# Patient Record
Sex: Male | Born: 1970 | Race: White | Hispanic: No | Marital: Single | Smoking: Never smoker
Health system: Southern US, Community
[De-identification: ages and names within clinical notes are randomized; demographics above are authoritative.]

## PROBLEM LIST (undated history)

## (undated) DIAGNOSIS — T7840XA Allergy, unspecified, initial encounter: Secondary | ICD-10-CM

## (undated) DIAGNOSIS — K08409 Partial loss of teeth, unspecified cause, unspecified class: Secondary | ICD-10-CM

## (undated) DIAGNOSIS — R002 Palpitations: Secondary | ICD-10-CM

## (undated) DIAGNOSIS — E785 Hyperlipidemia, unspecified: Secondary | ICD-10-CM

## (undated) HISTORY — DX: Palpitations: R00.2

## (undated) HISTORY — DX: Allergy, unspecified, initial encounter: T78.40XA

## (undated) HISTORY — DX: Hyperlipidemia, unspecified: E78.5

## (undated) HISTORY — DX: Partial loss of teeth, unspecified cause, unspecified class: K08.409

## (undated) HISTORY — PX: WISDOM TOOTH EXTRACTION: SHX21

---

## 2019-10-23 ENCOUNTER — Encounter: Payer: Self-pay | Admitting: Gastroenterology

## 2019-12-03 ENCOUNTER — Other Ambulatory Visit: Payer: Self-pay

## 2019-12-03 ENCOUNTER — Ambulatory Visit: Payer: 59 | Admitting: Cardiology

## 2019-12-03 ENCOUNTER — Encounter: Payer: Self-pay | Admitting: Cardiology

## 2019-12-03 VITALS — BP 120/80 | HR 72 | Ht 71.0 in | Wt 195.6 lb

## 2019-12-03 DIAGNOSIS — Z87892 Personal history of anaphylaxis: Secondary | ICD-10-CM | POA: Diagnosis not present

## 2019-12-03 DIAGNOSIS — R9431 Abnormal electrocardiogram [ECG] [EKG]: Secondary | ICD-10-CM | POA: Diagnosis not present

## 2019-12-03 DIAGNOSIS — Z8241 Family history of sudden cardiac death: Secondary | ICD-10-CM

## 2019-12-03 NOTE — Progress Notes (Signed)
Cardiology Office Note:    Date:  12/03/2019   ID:  Andrew Ingram, DOB 21-Apr-1971, MRN [id number]  PCP:  Sueanne Margarita, DO  [facility] Cardiologist:  [name], MD  [facility] Electrophysiologist:  None   Referring MD: Sueanne Margarita, DO    History of Present Illness:    Andrew Ingram is a 49 y.o. male here for the evaluation of family history of sudden cardiac death at the request of Dr. Maebelle Munroe  Has had a history of palpitations especially during the busy time of year with increased stress.  About 2 years ago felt a couple minutes of palpitations around Christmas time.  Maybe was feeling a panic attack at the time. Had some tightness in chest then, a couple years ago.  This has not been any persistent symptom.  Brother has been tested previously with EKG  He is also had yellowjacket allergy and has EpiPen handy.  He also had a younger brother who had a positive colonoscopy and wanted a referral to GI from his primary care physician.  His father died of arrhythmia at age 54, sudden cardiac death.  He works for the city of [employer] enjoys hiking golf yard work, worked at [employer] for 8 years.  Non-smoker.  Prior office notes reviewed from outside office.  Lab work reviewed.  Allergies:   Patient has no allergy information on record.   Social History   Socioeconomic History  . Marital status: Single    Spouse name: Not on file  . Number of children: Not on file  . Years of education: Not on file  . Highest education level: Not on file  Occupational History  . Not on file  Tobacco Use  . Smoking status: Never Smoker  . Smokeless tobacco: Never Used  Substance and Sexual Activity  . Alcohol use: Not on file  . Drug use: Not on file  . Sexual activity: Not on file  Other Topics Concern  . Not on file  Social History Narrative  . Not on file   Social Determinants of Health   Financial Resource Strain:   . Difficulty of Paying  Living Expenses:   Food Insecurity:   . Worried About Charity fundraiser in the Last Year:   . Arboriculturist in the Last Year:   Transportation Needs:   . Film/video editor (Medical):   Marland Kitchen Lack of Transportation (Non-Medical):   Physical Activity:   . Days of Exercise per Week:   . Minutes of Exercise per Session:   Stress:   . Feeling of Stress :   Social Connections:   . Frequency of Communication with Friends and Family:   . Frequency of Social Gatherings with Friends and Family:   . Attends Religious Services:   . Active Member of Clubs or Organizations:   . Attends Archivist Meetings:   Marland Kitchen Marital Status:      Family History: The patient's family history includes Sudden Cardiac Death (age of onset: 46) in his father.  ROS:   Please see the history of present illness.    No fevers chills nausea vomiting syncope bleeding all other systems reviewed and are negative.  EKGs/Labs/Other Studies Reviewed:    The following studies were reviewed today: Prior EKG from office outside  EKG: Sinus rhythm, heart rate 73 bpm, left axis deviation, no ST segment changes.  This is from outside office personally reviewed and interpreted.  Recent Labs: No results  found for requested labs within last 8760 hours.  Recent Lipid Panel No results found for: CHOL, TRIG, HDL, CHOLHDL, VLDL, LDLCALC, LDLDIRECT  Physical Exam:    VS:  BP 120/80   Pulse 72   Ht 5\' 11"  (1.803 m)   Wt 195 lb 9.6 oz (88.7 kg)   SpO2 99%   BMI 27.28 kg/m     Wt Readings from Last 3 Encounters:  12/03/19 195 lb 9.6 oz (88.7 kg)     GEN:  Well nourished, well developed in no acute distress HEENT: Normal NECK: No JVD; No carotid bruits LYMPHATICS: No lymphadenopathy CARDIAC: RRR, no murmurs, rubs, gallops RESPIRATORY:  Clear to auscultation without rales, wheezing or rhonchi  ABDOMEN: Soft, non-tender, non-distended MUSCULOSKELETAL:  No edema; No deformity  SKIN: Warm and  dry NEUROLOGIC:  Alert and oriented x 3 PSYCHIATRIC:  Normal affect   ASSESSMENT:    1. Nonspecific abnormal electrocardiogram (ECG) (EKG)   2. Family history of sudden cardiac death   3. History of anaphylaxis   4. Abnormal electrocardiogram    PLAN:    In order of problems listed above:  Family history of sudden cardiac death, abnormal EKG with left axis deviation -Father 2 died suddenly with arrhythmia. -We will go ahead and check an echocardiogram to ensure proper structure and function of his heart.  Try to exclude any phenotypic evidence of hypertrophic cardiomyopathy. -I will also check a coronary calcium score, $150, to help with further prevention efforts.  This may change therapy. -Current LDL 127 HDL 69 triglycerides 70, hemoglobin 16.7 creatinine 0.8 ALT 7 TSH 1.0 from outside labs. -Continue with diet and exercise.  History of anaphylaxis with yellowjacket -Has EpiPen.  Of course please let us know if you have any other questions or need any further assistance.  Medication Adjustments/Labs and Tests Ordered: Current medicines are reviewed at length with the patient today.  Concerns regarding medicines are outlined above.  Orders Placed This Encounter  Procedures  . CT CARDIAC SCORING  . ECHOCARDIOGRAM COMPLETE   No orders of the defined types were placed in this encounter.   Patient Instructions  Medication Instructions:  The current medical regimen is effective;  continue present plan and medications.  *If you need a refill on your cardiac medications before your next appointment, please call your pharmacy*  Testing/Procedures: Your physician has requested that you have an echocardiogram. Echocardiography is a painless test that uses sound waves to create images of your heart. It provides your doctor with information about the size and shape of your heart and how well your heart's chambers and valves are working. This procedure takes approximately one hour.  There are no restrictions for this procedure.  Your physician has requested that you have Coronary Calcium score which is completed by cardiac computed tomography (CT) is a painless test that uses an x-ray machine to take clear, detailed pictures of your heart.   Follow-Up: At [facility], you and your health needs are our priority.  As part of our continuing mission to provide you with exceptional heart care, we have created designated Provider Care Teams.  These Care Teams include your primary Cardiologist (physician) and Advanced Practice Providers (APPs -  Physician Assistants and Nurse Practitioners) who all work together to provide you with the care you need, when you need it.  We recommend signing up for the patient portal called "MyChart".  Sign up information is provided on this After Visit Summary.  MyChart is used to connect with  patients for Virtual Visits (Telemedicine).  Patients are able to view lab/test results, encounter notes, upcoming appointments, etc.  Non-urgent messages can be sent to your provider as well.   To learn more about what you can do with MyChart, go to [email or web address].    Follow up will be based on the reason of t he above testing.   Thank you for choosing [facility]!!         Signed, [name], MD  12/03/2019 2:59 PM    [place]

## 2019-12-03 NOTE — Patient Instructions (Signed)
Medication Instructions:  The current medical regimen is effective;  continue present plan and medications.  *If you need a refill on your cardiac medications before your next appointment, please call your pharmacy*  Testing/Procedures: Your physician has requested that you have an echocardiogram. Echocardiography is a painless test that uses sound waves to create images of your heart. It provides your doctor with information about the size and shape of your heart and how well your heart's chambers and valves are working. This procedure takes approximately one hour. There are no restrictions for this procedure.  Your physician has requested that you have Coronary Calcium score which is completed by cardiac computed tomography (CT) is a painless test that uses an x-ray machine to take clear, detailed pictures of your heart.   Follow-Up: At [facility], you and your health needs are our priority.  As part of our continuing mission to provide you with exceptional heart care, we have created designated Provider Care Teams.  These Care Teams include your primary Cardiologist (physician) and Advanced Practice Providers (APPs -  Physician Assistants and Nurse Practitioners) who all work together to provide you with the care you need, when you need it.  We recommend signing up for the patient portal called "MyChart".  Sign up information is provided on this After Visit Summary.  MyChart is used to connect with patients for Virtual Visits (Telemedicine).  Patients are able to view lab/test results, encounter notes, upcoming appointments, etc.  Non-urgent messages can be sent to your provider as well.   To learn more about what you can do with MyChart, go to [email or web address].    Follow up will be based on the reason of t he above testing.   Thank you for choosing [facility]!!

## 2019-12-10 ENCOUNTER — Other Ambulatory Visit: Payer: Self-pay

## 2019-12-10 ENCOUNTER — Ambulatory Visit (AMBULATORY_SURGERY_CENTER): Payer: Self-pay | Admitting: *Deleted

## 2019-12-10 VITALS — Ht 71.0 in | Wt 193.6 lb

## 2019-12-10 DIAGNOSIS — Z1211 Encounter for screening for malignant neoplasm of colon: Secondary | ICD-10-CM

## 2019-12-10 NOTE — Progress Notes (Signed)
cov vacc x 2  No egg or soy allergy known to patient  No issues with past sedation with any surgeries or procedures no intubation problems in the past  No diet pills per patient No home 02 use per patient  No blood thinners per patient  Pt denies issues with constipation  No A fib or A flutter  EMMI video to pt or MyChart  COVID 19 guidelines implemented in PV today   In PV today- noted pt saw Cardio 7-26- he had an Abnormal EKG- sudden death from arrhythmia in father so cardio has ordered Echo and CT cardiac scoring 01-21-2020- due to pending cardiac testing after scheduled colon 8-16- Colon canceled - Pt instructed he needs to Complete cardiac testing and get cardiac clearance prior to Colon- he verbalized understanding- recall placed for 10-1 #[id number] 8-16 colon canceled   Due to the COVID-19 pandemic we are asking patients to follow these guidelines. Please only bring one care partner. Please be aware that your care partner may wait in the car in the parking lot or if they feel like they will be too hot to wait in the car, they may wait in the lobby on the 4th floor. All care partners are required to wear a mask the entire time (we do not have any that we can provide them), they need to practice social distancing, and we will do a Covid check for all patient's and care partners when you arrive. Also we will check their temperature and your temperature. If the care partner waits in their car they need to stay in the parking lot the entire time and we will call them on their cell phone when the patient is ready for discharge so they can bring the car to the front of the building. Also all patient's will need to wear a mask into building.

## 2019-12-24 ENCOUNTER — Encounter: Payer: Self-pay | Admitting: Gastroenterology

## 2020-01-21 ENCOUNTER — Ambulatory Visit (HOSPITAL_COMMUNITY): Payer: 59 | Attending: Cardiology

## 2020-01-21 ENCOUNTER — Other Ambulatory Visit: Payer: Self-pay

## 2020-01-21 ENCOUNTER — Ambulatory Visit (INDEPENDENT_AMBULATORY_CARE_PROVIDER_SITE_OTHER)
Admission: RE | Admit: 2020-01-21 | Discharge: 2020-01-21 | Disposition: A | Discharge status: Home / Self Care | Payer: Self-pay | Source: Ambulatory Visit | Attending: Cardiology | Admitting: Cardiology

## 2020-01-21 DIAGNOSIS — Z8241 Family history of sudden cardiac death: Secondary | ICD-10-CM

## 2020-01-21 DIAGNOSIS — R9431 Abnormal electrocardiogram [ECG] [EKG]: Secondary | ICD-10-CM

## 2020-01-21 LAB — ECHOCARDIOGRAM COMPLETE
Area-P 1/2: 1.96 cm2
S' Lateral: 2.4 cm

## 2020-01-22 ENCOUNTER — Telehealth: Payer: Self-pay | Admitting: Cardiology

## 2020-01-22 DIAGNOSIS — E7800 Pure hypercholesterolemia, unspecified: Secondary | ICD-10-CM

## 2020-01-22 DIAGNOSIS — R931 Abnormal findings on diagnostic imaging of heart and coronary circulation: Secondary | ICD-10-CM

## 2020-01-22 MED ORDER — ROSUVASTATIN CALCIUM 20 MG PO TABS
20.0000 mg | ORAL_TABLET | Freq: Every day | ORAL | 3 refills | Status: DC
Start: 2020-01-22 — End: 2020-11-03

## 2020-01-22 NOTE — Telephone Encounter (Signed)
°  Patient is returning a call regarding his CT results

## 2020-01-22 NOTE — Telephone Encounter (Addendum)
Calcium score is 41 which is higher than expected for someone your age. 83rd percentile.  Based upon these findings and strong family history, I would recommend utilizing Crestor 20 mg once a day for risk factor reduction, to lower risk of heart attack. After starting this medication, we will check a lipid panel and ALT in 3 months. LDL was 127. Our goal will be less than 70.  In addition, aortic root was mildly dilated, 43 mm. I would like to repeat an echocardiogram in 1 year to monitor this. Pressure is under good control.  Andrew Furbish, MD   Called patient with results written above. Will send Crestor to patient's pharmacy of choice. Patient will get repeat lab work in December. Placed recall for patient to follow up with Andrew Ingram in one year. Patient was concerned about being cleared for colonoscopy in the near future. Will send to Andrew Ingram for advisement.

## 2020-01-24 ENCOUNTER — Telehealth: Payer: Self-pay | Admitting: *Deleted

## 2020-01-24 ENCOUNTER — Telehealth: Payer: Self-pay | Admitting: Cardiology

## 2020-01-24 NOTE — Telephone Encounter (Signed)
[facility] Pre-operative Risk Assessment     Request for surgical clearance:     Endoscopy Procedure  What type of surgery is being performed?     Colonoscopy   When is this surgery scheduled?     Required to have cardiac clearance prior to scheduling colonoscopy per Dr Harl Bowie   What type of clearance is required ?   Pharmacy/ Cardiac Clearance from Cardiologist due to elevated Cardiac Calcium Score at his age    Are there any medications that need to be held prior to surgery and how long? No unless he is on a blood thinner that's not listed in [place] name and name of physician performing surgery?      [facility]  What is your office phone and fax number?      Phone- [phone]  [phone]  Anesthesia type (None, local, MAC, general) ?       [facility]

## 2020-01-24 NOTE — Telephone Encounter (Signed)
Spoke with patient who is asking if he will have to take a statin long term.  Advised pt that general pts are required to continue to take a statin long term to keep cholesterol levels under control and also in his case, as preventive treatment for further coronary artery plaque/calcification.  Pt states understanding.  All questions were answered at the time of the call.  Pt was very grateful for the call back and information regarding his treatment plan.  He reports he will go ahead and start taking the Crestor as RXed.  He will c/b with any further questions/concerns.

## 2020-01-24 NOTE — Telephone Encounter (Signed)
See phone note from 9/16 for further documentation r/t statin therapy.   Also of note - message was sent to Hetty Ely, RN in GI requesting if pt needs cardiac clearance, for them to pls fax a formal request with type/date of procedure, type of anesthesia being used and if they require pt to hold any medications for the procedure.

## 2020-01-24 NOTE — Telephone Encounter (Signed)
Pt c/o medication issue:  1. Name of Medication: rosuvastatin (CRESTOR) 20 MG tablet  2. How are you currently taking this medication (dosage and times per day)? Not currently taking it   3. Are you having a reaction (difficulty breathing--STAT)? No   4. What is your medication issue? Gualberto is calling with additional questions in regards to this medication for Olin Hauser. Please advise.

## 2020-01-25 NOTE — Telephone Encounter (Signed)
Cardiac Clearance Faxed to Dr Marlou Porch office - attn Olin Hauser [place] to [phone] [facility]

## 2020-01-28 NOTE — Telephone Encounter (Signed)
I agree.  May proceed without any further ischemic testing.  [name], MD

## 2020-01-28 NOTE — Telephone Encounter (Signed)
Dr Silverio Decamp, I saw this pt in [place] 12-10-2019- In PV noted pt saw Cardio 7-26- he had an Abnormal EKG- sudden death from arrhythmia in father so cardio has ordered Echo and CT cardiac scoring 01-21-2020- due to pending cardiac testing after scheduled colon 8-16- Colon canceled - Pt instructed he needs to Complete cardiac testing and get cardiac clearance prior to Colon- he verbalized understanding- recall placed for 10-1 #[id number] 8-16 colon canceled   We have  cardiac clearance as below. Is it ok to schedule his colon now?  Please advise., Thanks Lelan Pons PV

## 2020-01-28 NOTE — Telephone Encounter (Signed)
   Primary Cardiologist: [name], MD  Chart reviewed as part of pre-operative protocol coverage. Patient was contacted 01/28/2020 in reference to pre-operative risk assessment for pending surgery as outlined below.  Andrew Ingram was last seen on 12/03/19 by Dr. Marlou Porch. He has family history of sudden cardiac death and otherwise hx of bee sting allergy. Calcium scoring was performed in the 83rd percentile. 2D echo with normal LVEF and dilated aortic root. He was not felt to require ischemic testing at that time.  GI requesting cardiac clearance for colonoscopy which is a low risk procedure. I spoke with patient who affirms he is able to exercise by walking and playing golf without any chest pain or dyspnea. Able to complete >4 METS without angina. Therefore, based on ACC/AHA guidelines, Andrew Ingram would be at acceptable risk for the planned procedure without further cardiovascular testing. Will also cc to Dr. Marlou Porch to make him aware clearance granted based on lack of ischemic symptoms.  I will route this recommendation to the requesting party via Epic fax function and remove from pre-op pool. Please call with questions.  Charlie Pitter, PA-C 01/28/2020, 9:33 AM

## 2020-01-30 NOTE — Telephone Encounter (Signed)
Yes, thanks

## 2020-01-30 NOTE — Telephone Encounter (Signed)
Called Andrew Ingram to call us and schedule colon and PV- as we have cardiac clearance from Dr Marlou Porch and Faythe Ghee to proceed per Dr Doneen Poisson PV

## 2020-01-31 NOTE — Telephone Encounter (Signed)
Lm on voice mail to call and schedule colon and PV as we have all clearance we need- marie PV

## 2020-02-05 ENCOUNTER — Encounter: Payer: Self-pay | Admitting: Gastroenterology

## 2020-02-27 ENCOUNTER — Other Ambulatory Visit: Payer: Self-pay

## 2020-02-27 ENCOUNTER — Ambulatory Visit (AMBULATORY_SURGERY_CENTER): Payer: Self-pay

## 2020-02-27 VITALS — Ht 71.0 in | Wt 192.0 lb

## 2020-02-27 DIAGNOSIS — Z1211 Encounter for screening for malignant neoplasm of colon: Secondary | ICD-10-CM

## 2020-02-27 MED ORDER — PLENVU 140 G PO SOLR: 1.0000 | ORAL | 0 refills | Status: DC

## 2020-02-27 NOTE — Progress Notes (Signed)
No egg or soy allergy known to patient  No issues with past sedation with any surgeries or procedures----never had surgery No intubation problems in the past ----never had surgery No FH of Malignant Hyperthermia No diet pills per patient No home 02 use per patient  No blood thinners per patient  Pt denies issues with constipation  No A fib or A flutter  EMMI video to pt COVID 19 guidelines implemented in PV today with Pt and RN  Coupon given to pt in PV today , Code to Pharmacy  COVID vaccines completed on 08/2019 per pt;  Due to the COVID-19 pandemic we are asking patients to follow these guidelines. Please only bring one care partner. Please be aware that your care partner may wait in the car in the parking lot or if they feel like they will be too hot to wait in the car, they may wait in the lobby on the 4th floor. All care partners are required to wear a mask the entire time (we do not have any that we can provide them), they need to practice social distancing, and we will do a Covid check for all patient's and care partners when you arrive. Also we will check their temperature and your temperature. If the care partner waits in their car they need to stay in the parking lot the entire time and we will call them on their cell phone when the patient is ready for discharge so they can bring the car to the front of the building. Also all patient's will need to wear a mask into building.

## 2020-02-28 ENCOUNTER — Encounter: Payer: Self-pay | Admitting: Gastroenterology

## 2020-03-17 ENCOUNTER — Encounter: Payer: 59 | Admitting: Internal Medicine

## 2020-03-17 ENCOUNTER — Encounter: Payer: Self-pay | Admitting: Gastroenterology

## 2020-03-17 ENCOUNTER — Other Ambulatory Visit: Payer: Self-pay

## 2020-03-17 ENCOUNTER — Ambulatory Visit (AMBULATORY_SURGERY_CENTER): Payer: 59 | Admitting: Gastroenterology

## 2020-03-17 VITALS — BP 115/79 | HR 65 | Temp 98.7°F | Resp 18 | Ht 71.0 in | Wt 192.0 lb

## 2020-03-17 DIAGNOSIS — D128 Benign neoplasm of rectum: Secondary | ICD-10-CM

## 2020-03-17 DIAGNOSIS — Z1211 Encounter for screening for malignant neoplasm of colon: Secondary | ICD-10-CM

## 2020-03-17 DIAGNOSIS — D123 Benign neoplasm of transverse colon: Secondary | ICD-10-CM

## 2020-03-17 DIAGNOSIS — D122 Benign neoplasm of ascending colon: Secondary | ICD-10-CM | POA: Diagnosis not present

## 2020-03-17 MED ORDER — SODIUM CHLORIDE 0.9 % IV SOLN: 500.0000 mL | Freq: Once | INTRAVENOUS | Status: DC

## 2020-03-17 NOTE — Progress Notes (Signed)
VS by CW  Pt's states no medical or surgical changes since previsit or office visit.  

## 2020-03-17 NOTE — Op Note (Signed)
[facility] Patient Name: Andrew Ingram Procedure Date: 03/17/2020 7:57 AM MRN: [id number] Endoscopist: Mallie Mussel L. Loletha Carrow , MD Age: 49 Referring MD:  Date of Birth: July 26, 1970 Gender: Male Account #: [id number] Procedure:                Colonoscopy Indications:              Screening for colorectal malignant neoplasm, This                            is the patient's first colonoscopy Medicines:                Monitored Anesthesia Care Procedure:                Pre-Anesthesia Assessment:                           - Prior to the procedure, a History and Physical                            was performed, and patient medications and                            allergies were reviewed. The patient's tolerance of                            previous anesthesia was also reviewed. The risks                            and benefits of the procedure and the sedation                            options and risks were discussed with the patient.                            All questions were answered, and informed consent                            was obtained. Prior Anticoagulants: The patient has                            taken no previous anticoagulant or antiplatelet                            agents. ASA Grade Assessment: II - A patient with                            mild systemic disease. After reviewing the risks                            and benefits, the patient was deemed in                            satisfactory condition to undergo the procedure.  After obtaining informed consent, the colonoscope                            was passed under direct vision. Throughout the                            procedure, the patient's blood pressure, pulse, and                            oxygen saturations were monitored continuously. The                            Colonoscope was introduced through the anus and                            advanced to the the cecum,  identified by                            appendiceal orifice and ileocecal valve. The                            colonoscopy was performed without difficulty. The                            patient tolerated the procedure well. The quality                            of the bowel preparation was excellent. The                            ileocecal valve, appendiceal orifice, and rectum                            were photographed. The bowel preparation used was                            Plenvu. Scope In: 8:11:45 AM Scope Out: 8:28:56 AM Scope Withdrawal Time: 0 hours 12 minutes 46 seconds  Total Procedure Duration: 0 hours 17 minutes 11 seconds  Findings:                 The perianal and digital rectal examinations were                            normal.                           Three sessile polyps were found in the rectum,                            proximal transverse colon and ascending colon. The                            polyps were 3 to 5 mm in size. These polyps were  removed with a cold snare. Resection and retrieval                            were complete.                           The exam was otherwise without abnormality on                            direct and retroflexion views. Complications:            No immediate complications. Estimated Blood Loss:     Estimated blood loss was minimal. Impression:               - Three 3 to 5 mm polyps in the rectum, in the                            proximal transverse colon and in the ascending                            colon, removed with a cold snare. Resected and                            retrieved.                           - The examination was otherwise normal on direct                            and retroflexion views. Recommendation:           - Patient has a contact number available for                            emergencies. The signs and symptoms of potential                             delayed complications were discussed with the                            patient. Return to normal activities tomorrow.                            Written discharge instructions were provided to the                            patient.                           - Resume previous diet.                           - Continue present medications.                           - Await pathology results.                           -  Repeat colonoscopy is recommended for                            surveillance. The colonoscopy date will be                            determined after pathology results from today's                            exam become available for review. Mersadies Petree L. Loletha Carrow, MD 03/17/2020 8:34:48 AM This report has been signed electronically.

## 2020-03-17 NOTE — Progress Notes (Signed)
To PACU, VSS. Report to [email or web address] 

## 2020-03-17 NOTE — Progress Notes (Signed)
Called to room to assist during endoscopic procedure.  Patient ID and intended procedure confirmed with present staff. Received instructions for my participation in the procedure from the performing physician.  

## 2020-03-17 NOTE — Patient Instructions (Signed)
Please read handouts provided. Continue present medications. Await pathology results.   YOU HAD AN ENDOSCOPIC PROCEDURE TODAY AT THE [facility]:   Refer to the procedure report that was given to you for any specific questions about what was found during the examination.  If the procedure report does not answer your questions, please call your gastroenterologist to clarify.  If you requested that your care partner not be given the details of your procedure findings, then the procedure report has been included in a sealed envelope for you to review at your convenience later.  YOU SHOULD EXPECT: Some feelings of bloating in the abdomen. Passage of more gas than usual.  Walking can help get rid of the air that was put into your GI tract during the procedure and reduce the bloating. If you had a lower endoscopy (such as a colonoscopy or flexible sigmoidoscopy) you may notice spotting of blood in your stool or on the toilet paper. If you underwent a bowel prep for your procedure, you may not have a normal bowel movement for a few days.  Please Note:  You might notice some irritation and congestion in your nose or some drainage.  This is from the oxygen used during your procedure.  There is no need for concern and it should clear up in a day or so.  SYMPTOMS TO REPORT IMMEDIATELY:  Following lower endoscopy (colonoscopy or flexible sigmoidoscopy):  Excessive amounts of blood in the stool  Significant tenderness or worsening of abdominal pains  Swelling of the abdomen that is new, acute  Fever of 100F or higher   For urgent or emergent issues, a gastroenterologist can be reached at any hour by calling [phone]. Do not use MyChart messaging for urgent concerns.    DIET:  We do recommend a small meal at first, but then you may proceed to your regular diet.  Drink plenty of fluids but you should avoid alcoholic beverages for 24 hours.  ACTIVITY:  You should plan to take it easy  for the rest of today and you should NOT DRIVE or use heavy machinery until tomorrow (because of the sedation medicines used during the test).    FOLLOW UP: Our staff will call the number listed on your records 48-72 hours following your procedure to check on you and address any questions or concerns that you may have regarding the information given to you following your procedure. If we do not reach you, we will leave a message.  We will attempt to reach you two times.  During this call, we will ask if you have developed any symptoms of COVID 19. If you develop any symptoms (ie: fever, flu-like symptoms, shortness of breath, cough etc.) before then, please call [phone].  If you test positive for Covid 19 in the 2 weeks post procedure, please call and report this information to us.    If any biopsies were taken you will be contacted by phone or by letter within the next 1-3 weeks.  Please call us at [phone] if you have not heard about the biopsies in 3 weeks.    SIGNATURES/CONFIDENTIALITY: You and/or your care partner have signed paperwork which will be entered into your electronic medical record.  These signatures attest to the fact that that the information above on your After Visit Summary has been reviewed and is understood.  Full responsibility of the confidentiality of this discharge information lies with you and/or your care-partner.  

## 2020-03-19 ENCOUNTER — Telehealth: Payer: Self-pay | Admitting: *Deleted

## 2020-03-19 NOTE — Telephone Encounter (Signed)
Attempted 2nd f/u phone call. No answer. Left message.  °

## 2020-03-19 NOTE — Telephone Encounter (Signed)
First f/u call attempt.  LVM

## 2020-03-26 ENCOUNTER — Encounter: Payer: Self-pay | Admitting: Gastroenterology

## 2020-04-21 ENCOUNTER — Other Ambulatory Visit: Payer: 59 | Admitting: *Deleted

## 2020-04-21 ENCOUNTER — Other Ambulatory Visit: Payer: Self-pay

## 2020-04-21 DIAGNOSIS — E7800 Pure hypercholesterolemia, unspecified: Secondary | ICD-10-CM

## 2020-04-21 DIAGNOSIS — R931 Abnormal findings on diagnostic imaging of heart and coronary circulation: Secondary | ICD-10-CM

## 2020-04-21 LAB — LIPID PANEL
Chol/HDL Ratio: 1.8 ratio (ref 0.0–5.0)
Cholesterol, Total: 128 mg/dL (ref 100–199)
HDL: 71 mg/dL (ref >39)
LDL Chol Calc (NIH): 47 mg/dL (ref 0–99)
Triglycerides: 39 mg/dL (ref 0–149)
VLDL Cholesterol Cal: 10 mg/dL (ref 5–40)

## 2020-04-21 LAB — ALT: ALT: 20 IU/L (ref 0–44)

## 2020-09-22 ENCOUNTER — Telehealth: Payer: Self-pay | Admitting: Cardiology

## 2020-09-22 DIAGNOSIS — Z8241 Family history of sudden cardiac death: Secondary | ICD-10-CM

## 2020-09-22 DIAGNOSIS — I77810 Thoracic aortic ectasia: Secondary | ICD-10-CM

## 2020-09-22 NOTE — Telephone Encounter (Signed)
Patient called to schedule his 1 year f/u and it states he needs an echo prior to the appointment, but there is no order.

## 2020-09-23 NOTE — Telephone Encounter (Signed)
Jerline Pain, MD  01/22/2020 1:38 PM EDT      Normal pump function Normal valves.  Dilated aortic root (50mm on ECHO, 71mm on CT)  Repeating echo in one year to monitor aorta  [name], MD   Order place for echo to be completed on or after 01/21/2021.

## 2020-09-24 NOTE — Telephone Encounter (Signed)
Message sent to scheduling for pt to be contacted to be scheduled as ordered.

## 2020-11-03 ENCOUNTER — Encounter: Payer: Self-pay | Admitting: Family

## 2020-11-03 ENCOUNTER — Other Ambulatory Visit: Payer: Self-pay

## 2020-11-03 ENCOUNTER — Ambulatory Visit: Payer: 59 | Admitting: Family

## 2020-11-03 VITALS — BP 116/80 | HR 64 | Ht 71.0 in | Wt 200.6 lb

## 2020-11-03 DIAGNOSIS — E785 Hyperlipidemia, unspecified: Secondary | ICD-10-CM | POA: Diagnosis not present

## 2020-11-03 DIAGNOSIS — I25118 Atherosclerotic heart disease of native coronary artery with other forms of angina pectoris: Secondary | ICD-10-CM | POA: Diagnosis not present

## 2020-11-03 DIAGNOSIS — I77810 Thoracic aortic ectasia: Secondary | ICD-10-CM

## 2020-11-03 MED ORDER — ROSUVASTATIN CALCIUM 20 MG PO TABS: 20.0000 mg | ORAL_TABLET | Freq: Every day | ORAL | 3 refills | Status: DC

## 2020-11-03 MED ORDER — ASPIRIN EC 81 MG PO TBEC: 81.0000 mg | DELAYED_RELEASE_TABLET | Freq: Every day | ORAL | 11 refills | Status: AC

## 2020-11-03 NOTE — Progress Notes (Signed)
Office Visit    Patient Name: Andrew Ingram Date of Encounter: 11/03/2020  PCP:  Sueanne Margarita, [place]  Cardiologist:  [name], MD  Advanced Practice Provider:  No care team member to display Electrophysiologist:  None   Chief Complaint    Andrew Ingram is a 50 y.o. male with a hx of palpitations, coronary artery disease, dilated aortic root, family history of sudden cardiac death, HLD presents today for follow up of CAD, HLD.   Past Medical History    Past Medical History:  Diagnosis Date   Allergy    seasonal allergies   Hyperlipidemia    on meds   Palpitations    Wisdom teeth extracted    Past Surgical History:  Procedure Laterality Date   WISDOM TOOTH EXTRACTION      Allergies  Allergies  Allergen Reactions   Bee Venom Anaphylaxis    History of Present Illness    Andrew Ingram is a 50 y.o. male with a hx of palpitations, coronary artery disease, dilated aortic root, family history of sudden cardiac death, HLD last seen 2019-12-14 by Dr. Marlou Porch.  He has a family history of sudden cardiac death with his father dying of arrhythmia at 62 years old.  He was evaluated July 2021 by Dr. Marlou Porch due to family history of sudden cardiac death.  He was recommended for echocardiogram and coronary calcium score.  His calcium score was 41 placing him in 83rd percentile for age/sex matched control. CT also showed 95mm RLL pulmonary nodule likely benign recommended for CT in 12 months if high risk. Also noted aneurysmal dilation of ascending thoracic arota 4.5 cm recommended for semi-annual imaging.  He was recommended to start Crestor 20 mg daily for risk reduction.  Echocardiogram showed mildly dilated aortic root 43 mm with recommended to repeat echocardiogram in 1 year.  LVEF was normal with no significant valvular abnormalities.  Lab by PCP 10/13/20 Total cholesterol 137, HDL 55, LDL 70, triglycerides 62 10/13/20 ALT 13, AST 18, GFR 89.3, TSH  0.89  He presents today for follow-up.  He works for the [employer] which he enjoys.  For exercise he enjoys walking outdoors and playing golf.  He endorses following a low-salt, heart healthy diet and does most of his own cooking at home. Reports no shortness of breath nor dyspnea on exertion. Reports no chest pain, pressure, or tightness. No edema, orthopnea, PND. Reports no palpitations.  We reviewed all of his cardiac testing over the last year as well as indications for statin and aspirin.  He was appreciative of the explanation.  EKGs/Labs/Other Studies Reviewed:   The following studies were reviewed today:  Echo 01/21/20  1. Left ventricular ejection fraction, by estimation, is 60 to 65%. The  left ventricle has normal function. The left ventricle has no regional  wall motion abnormalities. Left ventricular diastolic parameters were  normal.   2. Right ventricular systolic function is normal. The right ventricular  size is normal. Tricuspid regurgitation signal is inadequate for assessing  PA pressure.   3. The mitral valve is normal in structure. Trivial mitral valve  regurgitation. No evidence of mitral stenosis.   4. The aortic valve is tricuspid. Aortic valve regurgitation is not  visualized. No aortic stenosis is present.   5. Aortic dilatation noted. There is mild to moderate dilatation of the  ascending aorta, measuring 41 mm.   6. The inferior vena cava is normal in size with greater  than 50%  respiratory variability, suggesting right atrial pressure of 3 mmHg.   CT cardiac scoring 01/21/20 FINDINGS: Non-cardiac: See separate report from [facility].   Ascending Aorta: Dilated (43 mm at the level of pulmonary arteries). No atherosclerosis   Pericardium: Normal   Coronary arteries: Mild RCA and LAD calcification   IMPRESSION: 1. Coronary calcium score of 41. This was 4 percentile for age and sex matched control.   2.  Mildly dilated  ascending aorta - 43 mm.   [name], MD FACCFINDINGS: Non-cardiac: See separate report from [facility].   Ascending Aorta: Dilated (43 mm at the level of pulmonary arteries). No atherosclerosis   Pericardium: Normal   Coronary arteries: Mild RCA and LAD calcification   IMPRESSION: 1. Coronary calcium score of 41. This was 77 percentile for age and sex matched control.   2.  Mildly dilated ascending aorta - 43 mm.   [name], MD [facility]  IMPRESSION: 1. 3 mm right lower lobe pulmonary nodule, nonspecific, but statistically likely benign. No follow-up needed if patient is low-risk. Non-contrast chest CT can be considered in 12 months if patient is high-risk. This recommendation follows the consensus statement: Guidelines for Management of Incidental Pulmonary Nodules Detected on CT Images: From the Fleischner Society 2017; Radiology 2017; 284:228-243. 2. Aneurysmal dilatation of ascending thoracic aorta (4.5 cm in diameter). Ascending thoracic aortic aneurysm. Recommend semi-annual imaging followup by CTA or MRA and referral to cardiothoracic surgery if not already obtained. This recommendation follows 2010 ACCF/AHA/AATS/ACR/ASA/SCA/SCAI/SIR/STS/SVM Guidelines for the Diagnosis and Management of Patients With Thoracic Aortic Disease. Circulation. 2010; 121: V616-W737. Aortic aneurysm NOS (ICD10-I71.9).  EKG:  EKG is  ordered today.  The ekg ordered today demonstrates normal sinus rhythm 64 bpm with no acute ST/T wave changes.  Recent Labs: 04/21/2020: ALT 20  Recent Lipid Panel    Component Value Date/Time   CHOL 128 04/21/2020 1028   TRIG 39 04/21/2020 1028   HDL 71 04/21/2020 1028   CHOLHDL 1.8 04/21/2020 1028   LDLCALC 47 04/21/2020 1028    Home Medications   Current Meds  Medication Sig   aspirin EC 81 MG tablet Take 1 tablet (81 mg total) by mouth daily. Swallow whole.   EPINEPHrine 0.3 mg/0.3 mL IJ SOAJ injection Inject 0.3 mg into the muscle as  needed for anaphylaxis.   [DISCONTINUED] rosuvastatin (CRESTOR) 20 MG tablet Take 1 tablet (20 mg total) by mouth daily.     Review of Systems   All other systems reviewed and are otherwise negative except as noted above.  Physical Exam    VS:  BP 116/80   Pulse 64   Ht 5\' 11"  (1.803 m)   Wt 200 lb 9.6 oz (91 kg)   SpO2 98%   BMI 27.98 kg/m  , BMI Body mass index is 27.98 kg/m.  Wt Readings from Last 3 Encounters:  11/03/20 200 lb 9.6 oz (91 kg)  03/17/20 192 lb (87.1 kg)  02/27/20 192 lb (87.1 kg)    GEN: Well nourished, well developed, in no acute distress. HEENT: normal. Neck: Supple, no JVD, carotid bruits, or masses. Cardiac: RRR, no murmurs, rubs, or gallops. No clubbing, cyanosis, edema.  Radials/DP/PT 2+ and equal bilaterally.  Respiratory:  Respirations regular and unlabored, clear to auscultation bilaterally. GI: Soft, nontender, nondistended. MS: No deformity or atrophy. Skin: Warm and dry, no rash. Neuro:  Strength and sensation are intact. Psych: Normal affect.  Assessment & Plan    CAD- Stable with no anginal  symptoms. No indication for ischemic evaluation. GDMT includes Rosuvastatin 20mg  daily. Start Aspirin EC 81mg  daily. No indication for beta blocker due to low normal BP and HR.   HLD, LDL goal less than 70-lipid panel 04/21/2020 total cholesterol 120, HDL 71, LDL 47, triglycerides 39. 10/2020 LDL 70. Continue Rosuvastatin 20mg  daily, refill provided.   Dilated aortic root -43 mm by echo 01/21/2020.  Repeat echo scheduled for 01/19/2021. Continue optimal BP and lipid control.   Disposition: Follow up in 1 year(s) with Dr. Marlou Porch or APP  Signed, Loel Dubonnet, NP 11/03/2020, 10:40 AM [place]

## 2020-11-03 NOTE — Patient Instructions (Addendum)
Medication Instructions:  Your physician has recommended you make the following change in your medication:   START Aspirin EC 81mg  daily  *If you need a refill on your cardiac medications before your next appointment, please call your pharmacy*  Lab Work: None ordered today. Your cholesterol panel with primary care a couple weeks ago looks great!  Testing/Procedures: Your echocardiogram is scheduled for 01/19/21 as scheduled.    Follow-Up: At [facility], you and your health needs are our priority.  As part of our continuing mission to provide you with exceptional heart care, we have created designated Provider Care Teams.  These Care Teams include your primary Cardiologist (physician) and Advanced Practice Providers (APPs -  Physician Assistants and Nurse Practitioners) who all work together to provide you with the care you need, when you need it.  We recommend signing up for the patient portal called "MyChart".  Sign up information is provided on this After Visit Summary.  MyChart is used to connect with patients for Virtual Visits (Telemedicine).  Patients are able to view lab/test results, encounter notes, upcoming appointments, etc.  Non-urgent messages can be sent to your provider as well.   To learn more about what you can do with MyChart, go to [email or web address].    Your next appointment:   1 year(s)  The format for your next appointment:   In Person  Provider:   You may see [name], MD or one of the following Advanced Practice Providers on your designated Care Team:   Kathyrn Drown, NP   Other Instructions  Heart Healthy Diet Recommendations: A low-salt diet is recommended. Meats should be grilled, baked, or boiled. Avoid fried foods. Focus on lean protein sources like fish or chicken with vegetables and fruits. The American Heart Association is a Microbiologist!  American Heart Association Diet and Lifeystyle Recommendations   Exercise recommendations: The  American Heart Association recommends 150 minutes of moderate intensity exercise weekly. Try 30 minutes of moderate intensity exercise 4-5 times per week. This could include walking, jogging, or swimming.  Heart-Healthy Eating Plan Heart-healthy meal planning includes: Eating less unhealthy fats. Eating more healthy fats. Making other changes in your diet. Talk with your doctor or a diet specialist (dietitian) to create an eating plan that is right for you.  What are tips for following this plan? Cooking Avoid frying your food. Try to bake, boil, grill, or broil it instead. You can also reduce fat by: Removing the skin from poultry. Removing all visible fats from meats. Steaming vegetables in water or broth. Meal planning  At meals, divide your plate into four equal parts: Fill one-half of your plate with vegetables and green salads. Fill one-fourth of your plate with whole grains. Fill one-fourth of your plate with lean protein foods. Eat 4-5 servings of vegetables per day. A serving of vegetables is: 1 cup of raw or cooked vegetables. 2 cups of raw leafy greens. Eat 4-5 servings of fruit per day. A serving of fruit is: 1 medium whole fruit.  cup of dried fruit.  cup of fresh, frozen, or canned fruit.  cup of 100% fruit juice. Eat more foods that have soluble fiber. These are apples, broccoli, carrots, beans, peas, and barley. Try to get 20-30 g of fiber per day. Eat 4-5 servings of nuts, legumes, and seeds per week: 1 serving of dried beans or legumes equals  cup after being cooked. 1 serving of nuts is  cup. 1 serving of seeds equals 1 tablespoon.  General  information Eat more home-cooked food. Eat less restaurant, buffet, and fast food. Limit or avoid alcohol. Limit foods that are high in starch and sugar. Avoid fried foods. Lose weight if you are overweight. Keep track of how much salt (sodium) you eat. This is important if you have high blood pressure. Ask your  doctor to tell you more about this. Try to add vegetarian meals each week. Fats Choose healthy fats. These include olive oil and canola oil, flaxseeds, walnuts, almonds, and seeds. Eat more omega-3 fats. These include salmon, mackerel, sardines, tuna, flaxseed oil, and ground flaxseeds. Try to eat fish at least 2 times each week. Check food labels. Avoid foods with trans fats or high amounts of saturated fat. Limit saturated fats. These are often found in animal products, such as meats, butter, and cream. These are also found in plant foods, such as palm oil, palm kernel oil, and coconut oil. Avoid foods with partially hydrogenated oils in them. These have trans fats. Examples are stick margarine, some tub margarines, cookies, crackers, and other baked goods. What foods can I eat? Fruits All fresh, canned (in natural juice), or frozen fruits. Vegetables Fresh or frozen vegetables (raw, steamed, roasted, or grilled). Green salads. Grains Most grains. Choose whole wheat and whole grains most of the time. Rice andpasta, including brown rice and pastas made with whole wheat. Meats and other proteins Lean, well-trimmed beef, veal, pork, and lamb. Chicken and [place] without skin. All fish and shellfish. Wild duck, rabbit, pheasant, and venison. Egg whites or low-cholesterol egg substitutes. Dried beans, peas, lentils, and tofu. Seedsand most nuts. Dairy Low-fat or nonfat cheeses, including ricotta and mozzarella. Skim or 1% milk that is liquid, powdered, or evaporated. Buttermilk that is made with low-fatmilk. Nonfat or low-fat yogurt. Fats and oils Non-hydrogenated (trans-free) margarines. Vegetable oils, including soybean, sesame, sunflower, olive, peanut, safflower, corn, canola, and cottonseed. Salad dressings or mayonnaisemade with a vegetable oil. Beverages Mineral water. Coffee and tea. Diet carbonated beverages. Sweets and desserts Sherbet, gelatin, and fruit ice. Small amounts of dark  chocolate. Limit all sweets and desserts. Seasonings and condiments All seasonings and condiments. The items listed above may not be a complete list of foods and drinks you can eat. Contact a dietitian for more options. What foods should I avoid? Fruits Canned fruit in heavy syrup. Fruit in cream or butter sauce. Fried fruit. Limitcoconut. Vegetables Vegetables cooked in cheese, cream, or butter sauce. Fried vegetables. Grains Breads that are made with saturated or trans fats, oils, or whole milk. Croissants. Sweet rolls. Donuts. High-fat crackers,such as cheese crackers. Meats and other proteins Fatty meats, such as hot dogs, ribs, sausage, bacon, rib-eye roast or steak. High-fat deli meats, such as salami and bologna. Caviar. Domestic duck andgoose. Organ meats, such as liver. Dairy Cream, sour cream, cream cheese, and creamed cottage cheese. Whole-milk cheeses. Whole or 2% milk that is liquid, evaporated, or condensed. Whole buttermilk. Cream sauce or high-fat cheese sauce. Yogurt that is made fromwhole milk. Fats and oils Meat fat, or shortening. Cocoa butter, hydrogenated oils, palm oil, coconut oil, palm kernel oil. Solid fats and shortenings, including bacon fat, salt pork, lard, and butter. Nondairy cream substitutes. Salad dressings with cheeseor sour cream. Beverages Regular sodas and juice drinks with added sugar. Sweets and desserts Frosting. Pudding. Cookies. Cakes. Pies. Milk chocolate or white chocolate.Buttered syrups. Full-fat ice cream or ice cream drinks. The items listed above may not be a complete list of foods and drinks to avoid. Contact a dietitian  for more information. Summary Heart-healthy meal planning includes eating less unhealthy fats, eating more healthy fats, and making other changes in your diet. Eat a balanced diet. This includes fruits and vegetables, low-fat or nonfat dairy, lean protein, nuts and legumes, whole grains, and heart-healthy oils and  fats. This information is not intended to replace advice given to you by your health care provider. Make sure you discuss any questions you have with your healthcare provider. Document Revised: 06/30/2017 Document Reviewed: 06/03/2017 Elsevier Patient Education  2022 [employer].

## 2021-01-19 ENCOUNTER — Encounter (INDEPENDENT_AMBULATORY_CARE_PROVIDER_SITE_OTHER): Payer: Self-pay

## 2021-01-19 ENCOUNTER — Ambulatory Visit (HOSPITAL_COMMUNITY): Payer: 59 | Attending: Cardiology

## 2021-01-19 ENCOUNTER — Other Ambulatory Visit: Payer: Self-pay

## 2021-01-19 DIAGNOSIS — I77810 Thoracic aortic ectasia: Secondary | ICD-10-CM | POA: Insufficient documentation

## 2021-01-19 LAB — ECHOCARDIOGRAM COMPLETE
Area-P 1/2: 2.37 cm2
S' Lateral: 2.9 cm

## 2021-01-27 ENCOUNTER — Encounter: Payer: Self-pay | Admitting: *Deleted

## 2021-01-28 ENCOUNTER — Telehealth: Payer: Self-pay | Admitting: Cardiology

## 2021-01-28 NOTE — Telephone Encounter (Signed)
Patient was calling back for results. Please advise

## 2021-01-28 NOTE — Telephone Encounter (Signed)
Andrew Pain, MD  01/19/2021  6:49 PM EDT     Normal heart size and function. Reassuring Aorta 42 mm - mildly dilated.  Repeat ECHO in one year to monitor aorta Andrew Furbish, MD    Left the pt a message to call the office back to assist with endorsing echo results per Dr. Marlou Porch.

## 2021-02-03 NOTE — Telephone Encounter (Signed)
Andrew Pain, MD  01/19/2021  6:49 PM EDT     Normal heart size and function. Reassuring Aorta 42 mm - mildly dilated.  Repeat ECHO in one year to monitor aorta [name], MD    Letter of results and Dr Marlou Porch recommendation/orders mailed to pt 9/20/202 with request to call back if any questions or concerns.

## 2021-11-03 ENCOUNTER — Other Ambulatory Visit: Payer: Self-pay | Admitting: Family

## 2021-11-03 DIAGNOSIS — I25118 Atherosclerotic heart disease of native coronary artery with other forms of angina pectoris: Secondary | ICD-10-CM

## 2021-11-21 IMAGING — CT CT CARDIAC CORONARY ARTERY CALCIUM SCORE
3 series · 14 of 20 positions shown, 15 images · non-contrast
Comparison: None.
COMPARISON: None.

Addendum:
EXAM:
OVER-READ INTERPRETATION  CT CHEST

The following report is an over-read performed by radiologist Dr.
Siti Salbiah Mujiati [REDACTED] on 01/21/2020. This
over-read does not include interpretation of cardiac or coronary
anatomy or pathology. The coronary calcium score interpretation by
the cardiologist is attached.
CLINICAL DATA: Risk stratification
Coronary Calcium Score. 48 year old male with strong family history
of CAD
TECHNIQUE: The patient was scanned on a Siemens Force scanner. Axial
non-contrast 3 mm slices were carried out through the heart. The
data set was analyzed on a dedicated work station and scored using
the Agatson method.

[Series 2: casc 3.0 bv41 2 bestdiast 69 % · axial · 0.33mm/px · z∈[-230,-140]mm · 4 of 51 slices shown, 5 images]
[im 11/51  vessel]
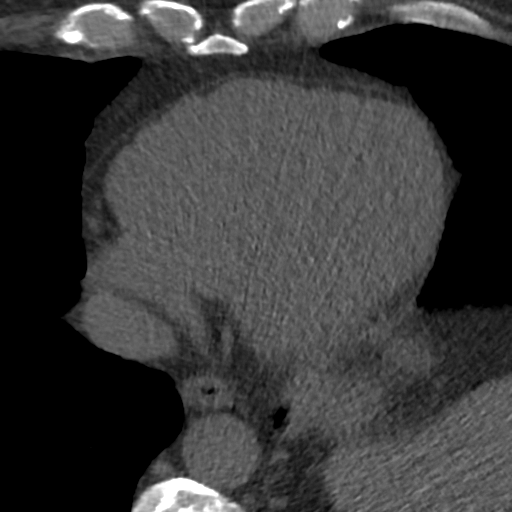
[im 11/51  lung]
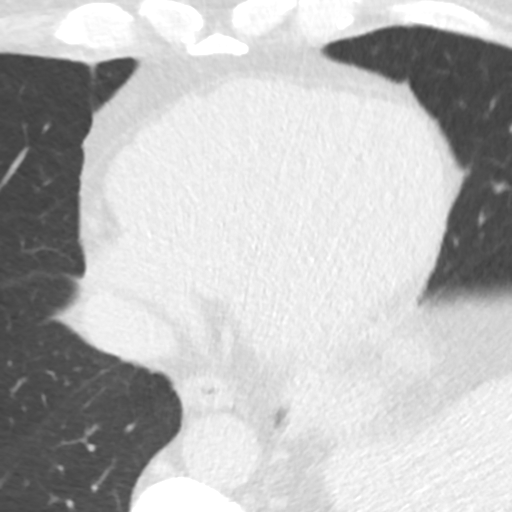
[im 21/51  vessel]
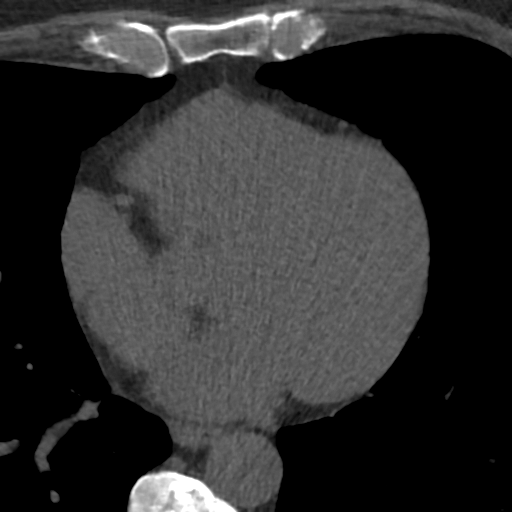
[im 31/51  vessel]
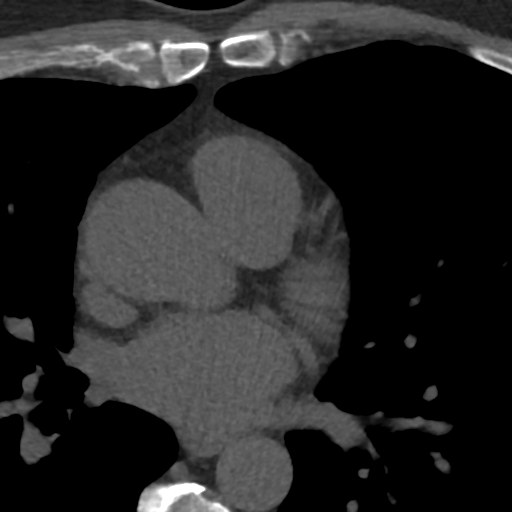
[im 41/51  vessel]
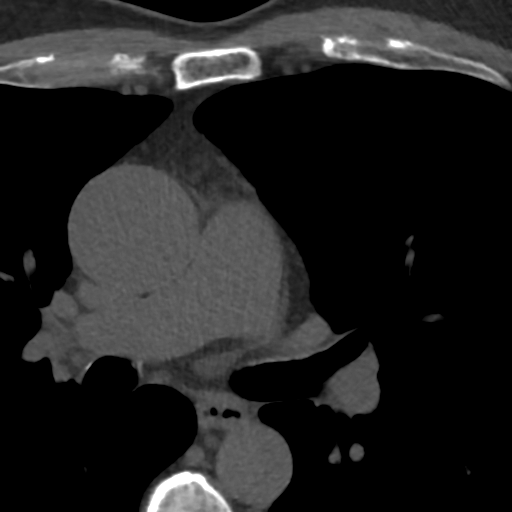

[Series 3: lung 69 % · axial · 0.77mm/px · z∈[-236,-137]mm · 5 of 51 slices shown]
[im 9/51  lung]
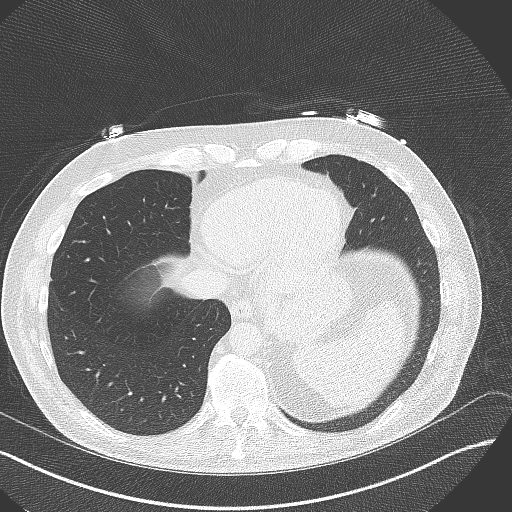
[im 17/51  lung]
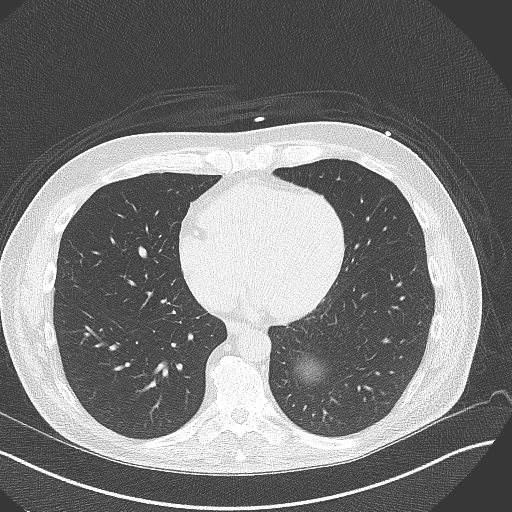
[im 26/51  lung]
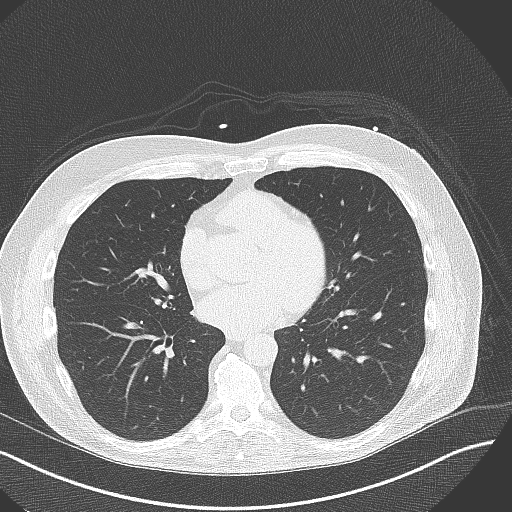
[im 34/51  lung]
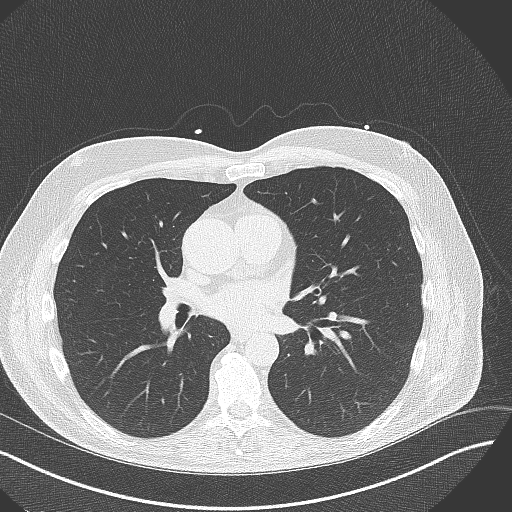
[im 42/51  lung]
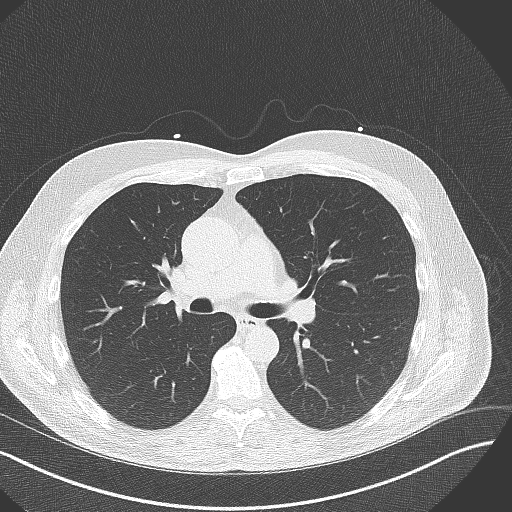

[Series 4: lung st 69 % · axial · 0.77mm/px · z∈[-236,-137]mm · 5 of 51 slices shown]
[im 9/51  lung]
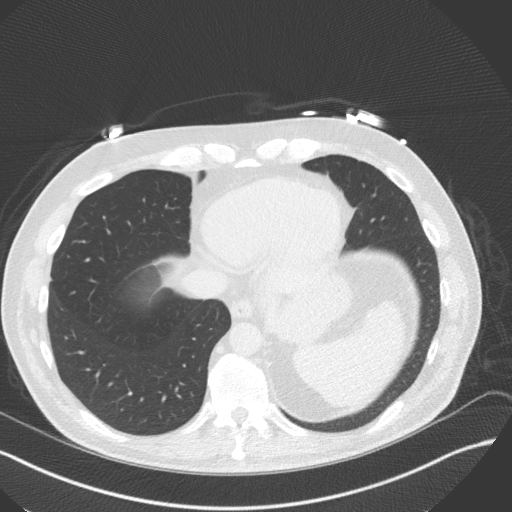
[im 17/51  lung]
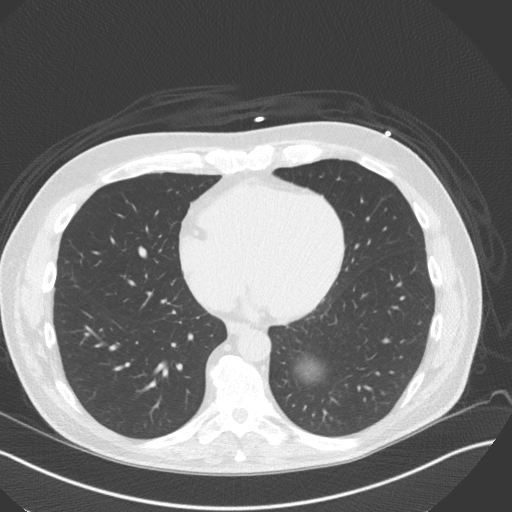
[im 26/51  lung]
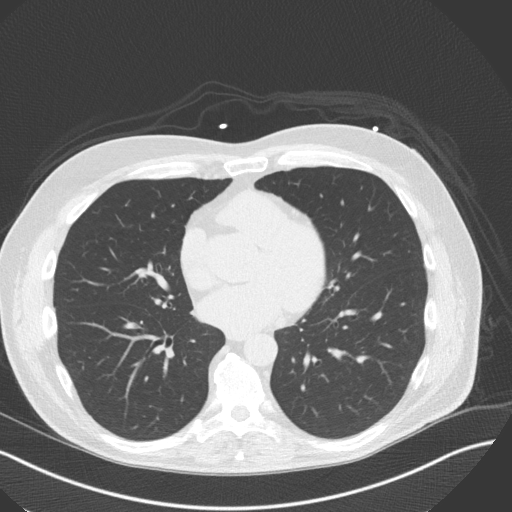
[im 34/51  lung]
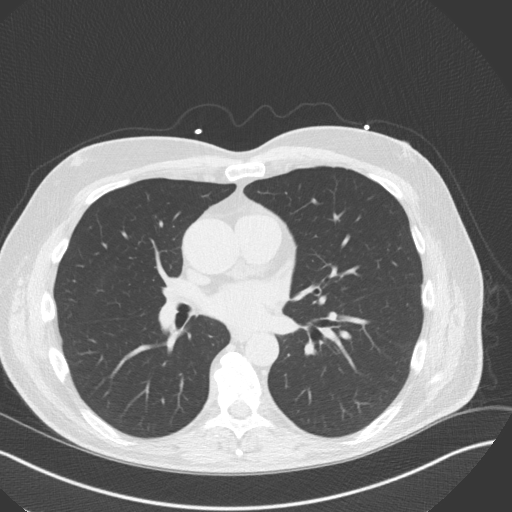
[im 42/51  lung]
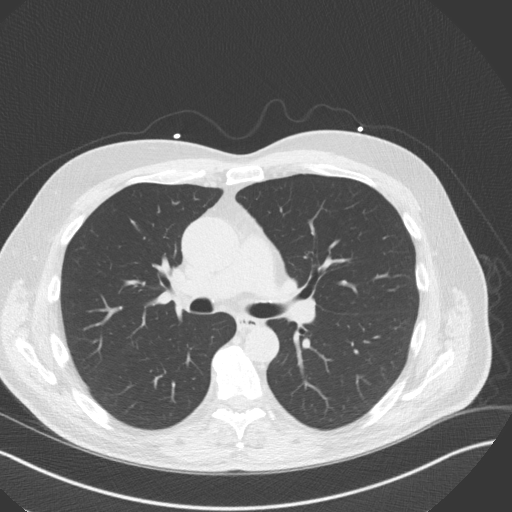

[14 of 20 positions shown; findings below may reference images not displayed]

FINDINGS: Mild aneurysmal dilatation of the ascending thoracic aorta (4.5 cm
in diameter). 3 mm right lower lobe pulmonary nodule (axial image 34
of series 3). Within the visualized portions of the thorax there are
no other larger more suspicious appearing pulmonary nodules or
masses, there is no acute consolidative airspace disease, no pleural
effusions, no pneumothorax and no lymphadenopathy. Visualized
portions of the upper abdomen are unremarkable. There are no
aggressive appearing lytic or blastic lesions noted in the
visualized portions of the skeleton.
IMPRESSION: 1. 3 mm right lower lobe pulmonary nodule, nonspecific, but
statistically likely benign. No follow-up needed if patient is
low-risk. Non-contrast chest CT can be considered in 12 months if
patient is high-risk. This recommendation follows the consensus
statement: Guidelines for Management of Incidental Pulmonary Nodules
Detected on CT Images: From the [HOSPITAL] 2901; Radiology
2901; [DATE].
2. Aneurysmal dilatation of ascending thoracic aorta (4.5 cm in
diameter). Ascending thoracic aortic aneurysm. Recommend semi-annual
imaging followup by CTA or MRA and referral to cardiothoracic
surgery if not already obtained. This recommendation follows 7979
ACCF/AHA/AATS/ACR/ASA/SCA/CEOLA/CYRCONCILE/LABELLE SALHA/TIGER Guidelines for the
Diagnosis and Management of Patients With Thoracic Aortic Disease.
Circulation. 7979; 121: [id number]. Aortic aneurysm NOS
(EQMWP-MZ1.W).
FINDINGS: Non-cardiac: See separate report from [REDACTED].

Ascending Aorta: Dilated (43 mm at the level of pulmonary arteries).
No atherosclerosis

Pericardium: Normal

Coronary arteries: Mild RCA and LAD calcification
IMPRESSION: 1. Coronary calcium score of 41. This was 83 percentile for age and
sex matched control.

2.  Mildly dilated ascending aorta - 43 mm.

*** End of Addendum ***
EXAM:
OVER-READ INTERPRETATION  CT CHEST

The following report is an over-read performed by radiologist Dr.
Siti Salbiah Mujiati [REDACTED] on 01/21/2020. This
over-read does not include interpretation of cardiac or coronary
anatomy or pathology. The coronary calcium score interpretation by
the cardiologist is attached.
FINDINGS: Mild aneurysmal dilatation of the ascending thoracic aorta (4.5 cm
in diameter). 3 mm right lower lobe pulmonary nodule (axial image 34
of series 3). Within the visualized portions of the thorax there are
no other larger more suspicious appearing pulmonary nodules or
masses, there is no acute consolidative airspace disease, no pleural
effusions, no pneumothorax and no lymphadenopathy. Visualized
portions of the upper abdomen are unremarkable. There are no
aggressive appearing lytic or blastic lesions noted in the
visualized portions of the skeleton.
IMPRESSION: 1. 3 mm right lower lobe pulmonary nodule, nonspecific, but
statistically likely benign. No follow-up needed if patient is
low-risk. Non-contrast chest CT can be considered in 12 months if
patient is high-risk. This recommendation follows the consensus
statement: Guidelines for Management of Incidental Pulmonary Nodules
Detected on CT Images: From the [HOSPITAL] 2901; Radiology
2901; [DATE].
2. Aneurysmal dilatation of ascending thoracic aorta (4.5 cm in
diameter). Ascending thoracic aortic aneurysm. Recommend semi-annual
imaging followup by CTA or MRA and referral to cardiothoracic
surgery if not already obtained. This recommendation follows 7979
ACCF/AHA/AATS/ACR/ASA/SCA/CEOLA/CYRCONCILE/LABELLE SALHA/TIGER Guidelines for the
Diagnosis and Management of Patients With Thoracic Aortic Disease.
Circulation. 7979; 121: [id number]. Aortic aneurysm NOS
(EQMWP-MZ1.W).

## 2022-05-24 ENCOUNTER — Ambulatory Visit: Payer: 59 | Attending: Cardiology | Admitting: Cardiology

## 2022-05-24 ENCOUNTER — Encounter: Payer: Self-pay | Admitting: Cardiology

## 2022-05-24 VITALS — BP 116/80 | HR 62 | Ht 71.0 in | Wt 210.6 lb

## 2022-05-24 DIAGNOSIS — E785 Hyperlipidemia, unspecified: Secondary | ICD-10-CM

## 2022-05-24 DIAGNOSIS — Z8241 Family history of sudden cardiac death: Secondary | ICD-10-CM | POA: Diagnosis not present

## 2022-05-24 DIAGNOSIS — I77810 Thoracic aortic ectasia: Secondary | ICD-10-CM | POA: Diagnosis not present

## 2022-05-24 DIAGNOSIS — I25118 Atherosclerotic heart disease of native coronary artery with other forms of angina pectoris: Secondary | ICD-10-CM

## 2022-05-24 MED ORDER — ROSUVASTATIN CALCIUM 20 MG PO TABS: 20.0000 mg | ORAL_TABLET | Freq: Every day | ORAL | 11 refills | Status: DC

## 2022-05-24 NOTE — Progress Notes (Signed)
Cardiology Office Note:    Date:  05/24/2022   ID:  Andrew Ingram, DOB 05/08/1971, MRN [id number]  PCP:  Sueanne Margarita, DO  [facility] Cardiologist:  [name], MD  [facility] Electrophysiologist:  None   Referring MD: Sueanne Margarita, DO    History of Present Illness:    Andrew Ingram is a 52 y.o. male here for the follow-up of family history of sudden cardiac death at the request of Dr. Maebelle Munroe.  Previously seen in July 2021.  Previously described palpitations especially during the busy times a year with stress.  Perhaps panic attack.  Felt some chest tightness as well.  Has been intermittent but not persistent.  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  Father died at age 80 from sudden cardiac death.  Works for the [employer].  Worked at [employer] for about 8 years. At that time we checked a echocardiogram as well as coronary calcium score.  Coronary calcium score 01/21/2020: 43, RCA and LAD calcification.  83rd percentile.  Mildly dilated ascending aorta 43 mm.  Echocardiogram 01/19/2021: EF 55 to 60% with grade 1 diastolic dysfunction trivial MR ascending aorta 42 mm  Allergies:   Bee venom   Social History   Socioeconomic History   Marital status: Single    Spouse name: Not on file   Number of children: Not on file   Years of education: Not on file   Highest education level: Not on file  Occupational History   Not on file  Tobacco Use   Smoking status: Never   Smokeless tobacco: Never  Vaping Use   Vaping Use: Never used  Substance and Sexual Activity   Alcohol use: Yes    Alcohol/week: 4.0 standard drinks of alcohol    Types: 4 Cans of beer per week   Drug use: Never   Sexual activity: Not on file  Other Topics Concern   Not on file  Social History Narrative   Not on file   Social Determinants of Health   Financial Resource Strain: Not on file  Food Insecurity: Not on file  Transportation Needs:  Not on file  Physical Activity: Not on file  Stress: Not on file  Social Connections: Not on file     Family History: The patient's family history includes Breast cancer (age of onset: 8) in his mother; CAD in his paternal grandfather; CVA in his maternal grandfather; Cancer - Ovarian in his paternal grandmother; Colon polyps (age of onset: 18) in his brother; Colon polyps (age of onset: 31) in his mother; Glaucoma in his paternal grandfather; Hyperlipidemia in his mother; Hypertension in his maternal grandfather; Sudden Cardiac Death (age of onset: 25) in his father. There is no history of Colon cancer, Esophageal cancer, Stomach cancer, or Rectal cancer.  ROS:   Please see the history of present illness.    No fevers chills nausea vomiting syncope bleeding all other systems reviewed and are negative.  EKGs/Labs/Other Studies Reviewed:    The following studies were reviewed today: Prior EKG from office outside   EKG:  EKG 05/24/2022 shows normal sinus rhythm 62 with no other significant abnormalities. Prior Sinus rhythm, heart rate 73 bpm, left axis deviation, no ST segment changes.  This is from outside office personally reviewed and interpreted.  Recent Labs: No results found for requested labs within last 365 days.  Recent Lipid Panel    Component Value Date/Time   CHOL 128 04/21/2020 1028   TRIG  39 04/21/2020 1028   HDL 71 04/21/2020 1028   CHOLHDL 1.8 04/21/2020 1028   LDLCALC 47 04/21/2020 1028    Physical Exam:    VS:  BP 116/80   Pulse 62   Ht '5\' 11"'$  (1.803 m)   Wt 210 lb 9.6 oz (95.5 kg)   SpO2 99%   BMI 29.37 kg/m     Wt Readings from Last 3 Encounters:  05/24/22 210 lb 9.6 oz (95.5 kg)  11/03/20 200 lb 9.6 oz (91 kg)  03/17/20 192 lb (87.1 kg)     GEN:  Well nourished, well developed in no acute distress HEENT: Normal NECK: No JVD; No carotid bruits LYMPHATICS: No lymphadenopathy CARDIAC: RRR, no murmurs, rubs, gallops RESPIRATORY:  Clear to  auscultation without rales, wheezing or rhonchi  ABDOMEN: Soft, non-tender, non-distended MUSCULOSKELETAL:  No edema; No deformity  SKIN: Warm and dry NEUROLOGIC:  Alert and oriented x 3 PSYCHIATRIC:  Normal affect   ASSESSMENT:    1. Coronary artery disease of native artery of native heart with stable angina pectoris ([place])   2. Dilated aortic root ([place])   3. Hyperlipidemia LDL goal <70   4. Family history of sudden cardiac death     PLAN:    In order of problems listed above:  Coronary artery disease/coronary artery calcification - On rosuvastatin 20 mg as well as aspirin 81 mg. -No anginal symptoms.  Continue with aggressive secondary risk factor prevention.  Explained pathophysiology of plaque stabilization.  Family history of sudden cardiac death Father 61 - Aggressive prevention strategies.  As above.  Dilated ascending aorta - We will go ahead and repeat echocardiogram.  Has been between 42 and 43 mm.  Blood pressure is under excellent control.  Hyperlipidemia - LDL went from 127 down to 59 with Crestor 20.  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   Medication Adjustments/Labs and Tests Ordered: Current medicines are reviewed at length with the patient today.  Concerns regarding medicines are outlined above.  Orders Placed This Encounter  Procedures   EKG 12-Lead   ECHOCARDIOGRAM COMPLETE   Meds ordered this encounter  Medications   rosuvastatin (CRESTOR) 20 MG tablet    Sig: Take 1 tablet (20 mg total) by mouth daily.    Dispense:  30 tablet    Refill:  11    Patient Instructions  Medication Instructions:   Your physician recommends that you continue on your current medications as directed. Please refer to the Current Medication list given to you today.  *If you need a refill on your cardiac medications before your next appointment, please call your  pharmacy*   Testing/Procedures:  Your physician has requested that you have an echocardiogram. Echocardiography is a painless test that uses sound waves to create images of your heart. It provides your doctor with information about the size and shape of your heart and how well your heart's chambers and valves are working. This procedure takes approximately one hour. There are no restrictions for this procedure. Please do NOT wear cologne, perfume, aftershave, or lotions (deodorant is allowed). Please arrive 15 minutes prior to your appointment time.    Follow-Up: At [facility], you and your health needs are our priority.  As part of our continuing mission to provide you with exceptional heart care, we have created designated Provider Care Teams.  These Care Teams include your primary Cardiologist (physician) and Advanced Practice  Providers (APPs -  Physician Assistants and Nurse Practitioners) who all work together to provide you with the care you need, when you need it.  We recommend signing up for the patient portal called "MyChart".  Sign up information is provided on this After Visit Summary.  MyChart is used to connect with patients for Virtual Visits (Telemedicine).  Patients are able to view lab/test results, encounter notes, upcoming appointments, etc.  Non-urgent messages can be sent to your provider as well.   To learn more about what you can do with MyChart, go to [email or web address].    Your next appointment:   1 year(s)  Provider:   [name], MD        Signed, [name], MD  05/24/2022 10:00 AM    [place]

## 2022-05-24 NOTE — Patient Instructions (Signed)
Medication Instructions:   Your physician recommends that you continue on your current medications as directed. Please refer to the Current Medication list given to you today.  *If you need a refill on your cardiac medications before your next appointment, please call your pharmacy*   Testing/Procedures:  Your physician has requested that you have an echocardiogram. Echocardiography is a painless test that uses sound waves to create images of your heart. It provides your doctor with information about the size and shape of your heart and how well your heart's chambers and valves are working. This procedure takes approximately one hour. There are no restrictions for this procedure. Please do NOT wear cologne, perfume, aftershave, or lotions (deodorant is allowed). Please arrive 15 minutes prior to your appointment time.    Follow-Up: At [facility], you and your health needs are our priority.  As part of our continuing mission to provide you with exceptional heart care, we have created designated Provider Care Teams.  These Care Teams include your primary Cardiologist (physician) and Advanced Practice Providers (APPs -  Physician Assistants and Nurse Practitioners) who all work together to provide you with the care you need, when you need it.  We recommend signing up for the patient portal called "MyChart".  Sign up information is provided on this After Visit Summary.  MyChart is used to connect with patients for Virtual Visits (Telemedicine).  Patients are able to view lab/test results, encounter notes, upcoming appointments, etc.  Non-urgent messages can be sent to your provider as well.   To learn more about what you can do with MyChart, go to [email or web address].    Your next appointment:   1 year(s)  Provider:   [name], MD

## 2022-06-28 ENCOUNTER — Ambulatory Visit (HOSPITAL_COMMUNITY): Payer: 59 | Attending: Cardiology

## 2022-06-28 DIAGNOSIS — I77810 Thoracic aortic ectasia: Secondary | ICD-10-CM | POA: Insufficient documentation

## 2022-06-28 LAB — ECHOCARDIOGRAM COMPLETE
Area-P 1/2: 3.21 cm2
S' Lateral: 2.8 cm

## 2022-07-02 ENCOUNTER — Encounter: Payer: Self-pay | Admitting: *Deleted

## 2023-05-29 NOTE — Progress Notes (Unsigned)
Cardiology Office Note    Patient Name: Andrew Ingram Date of Encounter: 05/30/2023  Primary Care Provider:  Charlane Ferretti, DO Primary Cardiologist:  Donato Schultz, MD Primary Electrophysiologist: None   Past Medical History    Past Medical History:  Diagnosis Date   Allergy    seasonal allergies   Hyperlipidemia    on meds   Palpitations    Wisdom teeth extracted     History of Present Illness  Andrew Ingram is a 53 y.o. male with a PMH of CAD, palpitations, dilated aortic root, family history of sudden cardiac death, HLD who presents today for 1 year follow-up.  Andrew Ingram was seen initially by Dr. Anne Fu on 11/2019 for complaint of palpitations.  He endorsed palpitations and has a family history of sudden cardiac death with father dying at 28.  He underwent a 2D echo that showed EF of 60 to 65% with no RWMA and dilated aortic root at 41 mm).  He also underwent coronary calcium score that showed score of 41 percentile and was started on Crestor 20 mg once daily.CT also showed 3mm RLL pulmonary nodule likely benign recommended for CT in 12 months if high risk. He was last seen by Dr. Anne Fu on 05/24/2022 and reported doing well with no anginal symptoms with controlled BP and continued on current GDMT.  Andrew Ingram presents today for 33-month follow-up.  He reports doing well with no new cardiac complaints since his previous follow-up.  He is currently tolerating his medications well without any adverse reactions.  He is staying active and walks twice a week when weather permits.  He reports also following a heart healthy diet and continues to work for the [employer].  He is currently followed by PCP for lipids that were well-controlled and within normal limits.  The patient maintains an active lifestyle and adheres to a heart-healthy diet. Patient denies chest pain, palpitations, dyspnea, PND, orthopnea, nausea, vomiting, dizziness, syncope, edema, weight gain, or early  satiety.   Review of Systems  Please see the history of present illness.    All other systems reviewed and are otherwise negative except as noted above.  Physical Exam    Wt Readings from Last 3 Encounters:  05/30/23 205 lb 3.2 oz (93.1 kg)  05/24/22 210 lb 9.6 oz (95.5 kg)  11/03/20 200 lb 9.6 oz (91 kg)   VS: Vitals:   05/30/23 1105  BP: 122/86  Pulse: 64  SpO2: 97%  ,Body mass index is 28.62 kg/m. GEN: Well nourished, well developed in no acute distress Neck: No JVD; No carotid bruits Pulmonary: Clear to auscultation without rales, wheezing or rhonchi  Cardiovascular: Normal rate. Regular rhythm. Normal S1. Normal S2.   Murmurs: There is no murmur.  ABDOMEN: Soft, non-tender, non-distended EXTREMITIES:  No edema; No deformity   EKG/LABS/ Recent Cardiac Studies   ECG personally reviewed by me today -sinus rhythm with rate of 67 bpm and no acute changes consistent with previous EKG.  Risk Assessment/Calculations:          No results found for: "WBC", "HGB", "HCT", "MCV", "PLT" No results found for: "CREATININE", "BUN", "NA", "K", "CL", "CO2" Lab Results  Component Value Date   CHOL 128 04/21/2020   HDL 71 04/21/2020   LDLCALC 47 04/21/2020   TRIG 39 04/21/2020   CHOLHDL 1.8 04/21/2020    No results found for: "HGBA1C" Assessment & Plan    1.  Nonobstructive CAD: -Previous calcium score completed showing nonobstructive disease with score  of 41. -Family history of early cardiac death. Patient is managing risk factors well with medication, diet, and exercise.  2.  Dilated ascending aorta: Stable since last echocardiogram in 2024. No need for repeat CT scan as dilation has improved and patient is asymptomatic. -Continue current management and monitoring.  3.  HLD: Controlled with Crestor. No reported side effects. -Refill Crestor prescription. -Continue current management and monitoring.  4.  Family history of sudden cardiac death: -Father died at age  65 -Consider Lipoprotein A testing for further risk stratification. Patient to research and decide.   Disposition: Follow-up with Donato Schultz, MD or APP in 12 months   Signed, Napoleon Form, Leodis Rains, NP 05/30/2023, 11:19 AM [facility]

## 2023-05-30 ENCOUNTER — Ambulatory Visit: Payer: 59 | Attending: Nurse Practitioner | Admitting: Nurse Practitioner

## 2023-05-30 ENCOUNTER — Encounter: Payer: Self-pay | Admitting: Nurse Practitioner

## 2023-05-30 VITALS — BP 122/86 | HR 64 | Ht 71.0 in | Wt 205.2 lb

## 2023-05-30 DIAGNOSIS — E785 Hyperlipidemia, unspecified: Secondary | ICD-10-CM

## 2023-05-30 DIAGNOSIS — I25118 Atherosclerotic heart disease of native coronary artery with other forms of angina pectoris: Secondary | ICD-10-CM

## 2023-05-30 DIAGNOSIS — Z8241 Family history of sudden cardiac death: Secondary | ICD-10-CM

## 2023-05-30 DIAGNOSIS — I77810 Thoracic aortic ectasia: Secondary | ICD-10-CM

## 2023-05-30 MED ORDER — ROSUVASTATIN CALCIUM 20 MG PO TABS: 20.0000 mg | ORAL_TABLET | Freq: Every day | ORAL | 11 refills | Status: DC

## 2023-05-30 NOTE — Patient Instructions (Signed)
Medication Instructions:  Your physician recommends that you continue on your current medications as directed. Please refer to the Current Medication list given to you today.  *If you need a refill on your cardiac medications before your next appointment, please call your pharmacy*   Lab Work: NONE If you have labs (blood work) drawn today and your tests are completely normal, you will receive your results only by: MyChart Message (if you have MyChart) OR A paper copy in the mail If you have any lab test that is abnormal or we need to change your treatment, we will call you to review the results.   Testing/Procedures: NONE   Follow-Up: At [facility], you and your health needs are our priority.  As part of our continuing mission to provide you with exceptional heart care, we have created designated Provider Care Teams.  These Care Teams include your primary Cardiologist (physician) and Advanced Practice Providers (APPs -  Physician Assistants and Nurse Practitioners) who all work together to provide you with the care you need, when you need it.  We recommend signing up for the patient portal called "MyChart".  Sign up information is provided on this After Visit Summary.  MyChart is used to connect with patients for Virtual Visits (Telemedicine).  Patients are able to view lab/test results, encounter notes, upcoming appointments, etc.  Non-urgent messages can be sent to your provider as well.   To learn more about what you can do with MyChart, go to [email or web address].    Your next appointment:   1 year(s)  Provider:   Donato Schultz, MD or Robin Searing, NP

## 2024-05-08 ENCOUNTER — Other Ambulatory Visit: Payer: Self-pay | Admitting: Nurse Practitioner

## 2024-05-08 DIAGNOSIS — I25118 Atherosclerotic heart disease of native coronary artery with other forms of angina pectoris: Secondary | ICD-10-CM

## 2024-05-08 DIAGNOSIS — I77810 Thoracic aortic ectasia: Secondary | ICD-10-CM

## 2024-05-08 DIAGNOSIS — Z8241 Family history of sudden cardiac death: Secondary | ICD-10-CM

## 2024-05-08 DIAGNOSIS — E785 Hyperlipidemia, unspecified: Secondary | ICD-10-CM

## 2024-07-02 ENCOUNTER — Ambulatory Visit: Admitting: Cardiology
